# Patient Record
Sex: Female | Born: 1995 | Race: Black or African American | Hispanic: No | Marital: Single | Smoking: Current some day smoker
Health system: Southern US, Community
[De-identification: ages and names within clinical notes are randomized; demographics above are authoritative.]

## PROBLEM LIST (undated history)

## (undated) DIAGNOSIS — J45909 Unspecified asthma, uncomplicated: Secondary | ICD-10-CM

## (undated) HISTORY — PX: OTHER SURGICAL HISTORY: SHX169

---
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
## 2015-01-18 ENCOUNTER — Inpatient Hospital Stay (HOSPITAL_COMMUNITY): Payer: Medicaid Other

## 2015-01-18 ENCOUNTER — Inpatient Hospital Stay (HOSPITAL_COMMUNITY)
Admission: AD | Admit: 2015-01-18 | Discharge: 2015-01-18 | Disposition: A | Discharge status: Home / Self Care | Payer: Medicaid Other | Source: Ambulatory Visit | Attending: Obstetrics & Gynecology | Admitting: Obstetrics & Gynecology

## 2015-01-18 DIAGNOSIS — O209 Hemorrhage in early pregnancy, unspecified: Secondary | ICD-10-CM | POA: Diagnosis not present

## 2015-01-18 DIAGNOSIS — O99511 Diseases of the respiratory system complicating pregnancy, first trimester: Secondary | ICD-10-CM | POA: Diagnosis not present

## 2015-01-18 DIAGNOSIS — J45909 Unspecified asthma, uncomplicated: Secondary | ICD-10-CM | POA: Insufficient documentation

## 2015-01-18 DIAGNOSIS — R109 Unspecified abdominal pain: Secondary | ICD-10-CM | POA: Insufficient documentation

## 2015-01-18 DIAGNOSIS — O9989 Other specified diseases and conditions complicating pregnancy, childbirth and the puerperium: Secondary | ICD-10-CM

## 2015-01-18 DIAGNOSIS — Z3A01 Less than 8 weeks gestation of pregnancy: Secondary | ICD-10-CM | POA: Insufficient documentation

## 2015-01-18 DIAGNOSIS — J4520 Mild intermittent asthma, uncomplicated: Secondary | ICD-10-CM

## 2015-01-18 DIAGNOSIS — F17210 Nicotine dependence, cigarettes, uncomplicated: Secondary | ICD-10-CM | POA: Diagnosis not present

## 2015-01-18 DIAGNOSIS — O26899 Other specified pregnancy related conditions, unspecified trimester: Secondary | ICD-10-CM

## 2015-01-18 LAB — CBC WITH DIFFERENTIAL/PLATELET
Basophils Absolute: 0 10*3/uL (ref 0.0–0.1)
Basophils Relative: 0 % (ref 0–1)
Eosinophils Absolute: 0.1 10*3/uL (ref 0.0–0.7)
Eosinophils Relative: 1 % (ref 0–5)
HEMATOCRIT: 31.6 % — AB (ref 36.0–46.0)
Hemoglobin: 10.8 g/dL — ABNORMAL LOW (ref 12.0–15.0)
Lymphocytes Relative: 21 % (ref 12–46)
Lymphs Abs: 2.7 10*3/uL (ref 0.7–4.0)
MCH: 31.2 pg (ref 26.0–34.0)
MCHC: 34.2 g/dL (ref 30.0–36.0)
MCV: 91.3 fL (ref 78.0–100.0)
MONOS PCT: 6 % (ref 3–12)
Monocytes Absolute: 0.7 10*3/uL (ref 0.1–1.0)
NEUTROS ABS: 9.2 10*3/uL — AB (ref 1.7–7.7)
Neutrophils Relative %: 72 % (ref 43–77)
Platelets: 245 10*3/uL (ref 150–400)
RBC: 3.46 MIL/uL — ABNORMAL LOW (ref 3.87–5.11)
RDW: 13.6 % (ref 11.5–15.5)
WBC: 12.7 10*3/uL — ABNORMAL HIGH (ref 4.0–10.5)

## 2015-01-18 LAB — POCT PREGNANCY, URINE: Preg Test, Ur: POSITIVE — AB

## 2015-01-18 LAB — URINALYSIS, ROUTINE W REFLEX MICROSCOPIC
BILIRUBIN URINE: NEGATIVE
Glucose, UA: NEGATIVE mg/dL
HGB URINE DIPSTICK: NEGATIVE
Ketones, ur: NEGATIVE mg/dL
LEUKOCYTES UA: NEGATIVE
NITRITE: NEGATIVE
Protein, ur: NEGATIVE mg/dL
Specific Gravity, Urine: 1.025 (ref 1.005–1.030)
Urobilinogen, UA: 1 mg/dL (ref 0.0–1.0)
pH: 6.5 (ref 5.0–8.0)

## 2015-01-18 LAB — HCG, QUANTITATIVE, PREGNANCY: hCG, Beta Chain, Quant, S: 6026 m[IU]/mL — ABNORMAL HIGH (ref <5)

## 2015-01-18 LAB — ABO/RH: ABO/RH(D): AB POS

## 2015-01-18 MED ORDER — ALBUTEROL SULFATE HFA 108 (90 BASE) MCG/ACT IN AERS: 1.0000 | INHALATION_SPRAY | Freq: Four times a day (QID) | RESPIRATORY_TRACT | Status: AC | PRN

## 2015-01-18 NOTE — MAU Provider Note (Signed)
History     CSN: [id number]  Arrival date and time: 01/18/15 1414   None     Chief Complaint  Patient presents with  . Abdominal Pain   HPI Yolanda Colon 19 y.o. G1P0 at unknown gestational age reports to MAU for abdominal pain in suspected pregnancy.  Pregnancy test here today is positive.  Pt notes pain in mid-lower abdomen that is crampy and mild. It occurs after standing for long p[eriods of time and is relieved with rest.   She does not need to take medication for it but was concerned because of the potential pregnancy.  She denies vaginal bleeding, fever, weakness, shortness of breath, chest pain.   She notes she is out of her rescue inhaler and requests refill in case of emergency.   OB History    No data available      Past Medical History  Diagnosis Date  . Asthma     Past Surgical History  Procedure Laterality Date  . Arm surgery      No family history on file.  History  Substance Use Topics  . Smoking status: Current Some Day Smoker -- 0.50 packs/day    Types: Cigarettes  . Smokeless tobacco: Not on file  . Alcohol Use: No    Allergies: No Known Allergies  Prescriptions prior to admission  Medication Sig Dispense Refill Last Dose  . ALBUTEROL IN Inhale 1-2 puffs into the lungs every 4 (four) hours as needed (for shortness of breath).    unknown  . amphetamine-dextroamphetamine (ADDERALL XR) 30 MG 24 hr capsule Take 30 mg by mouth daily.     . beclomethasone (QVAR) 80 MCG/ACT inhaler Inhale 2 puffs into the lungs 2 (two) times daily as needed (for shortness of breath).    2 weeks ago  . naproxen (NAPROSYN) 500 MG tablet Take 1 tablet (500 mg total) by mouth 2 (two) times daily. 30 tablet 0   . phenazopyridine (PYRIDIUM) 200 MG tablet Take 1 tablet (200 mg total) by mouth 3 (three) times daily. 6 tablet 0     ROS Pertinent ROS in HPI.  All other systems are negative.   Physical Exam   Blood pressure 104/67, pulse 89, temperature 98.1 F (36.7 C),  temperature source Oral, resp. rate 16, height 5\' 6"  (1.676 m), weight 175 lb 4 oz (79.493 kg), last menstrual period 12/14/2014.  Physical Exam  Constitutional: She is oriented to person, place, and time. She appears well-developed and well-nourished. No distress.  HENT:  Head: Normocephalic and atraumatic.  Eyes: EOM are normal.  Neck: Normal range of motion.  Cardiovascular: Normal rate.   Respiratory: Effort normal. No respiratory distress.  GI: Soft. She exhibits no distension.  Musculoskeletal: Normal range of motion.  Neurological: She is alert and oriented to person, place, and time.  Psychiatric: She has a normal mood and affect. Her behavior is normal.    MAU Course  Procedures  MDM Ectopic workup ordered due to positive pregnancy test and abdominal pain (Incorrectly ordered labs under vaginal bleeding - which she is NOT having).   Results for orders placed or performed during the hospital encounter of 01/18/15 (from the past 48 hour(s))  Urinalysis, Routine w reflex microscopic (not at [facility])     Status: None   Collection Time: 01/18/15  3:21 PM  Result Value Ref Range   Color, Urine YELLOW YELLOW   APPearance CLEAR CLEAR   Specific Gravity, Urine 1.025 1.005 - 1.030   pH 6.5 5.0 -  8.0   Glucose, UA NEGATIVE NEGATIVE mg/dL   Hgb urine dipstick NEGATIVE NEGATIVE   Bilirubin Urine NEGATIVE NEGATIVE   Ketones, ur NEGATIVE NEGATIVE mg/dL   Protein, ur NEGATIVE NEGATIVE mg/dL   Urobilinogen, UA 1.0 0.0 - 1.0 mg/dL   Nitrite NEGATIVE NEGATIVE   Leukocytes, UA NEGATIVE NEGATIVE    Comment: MICROSCOPIC NOT DONE ON URINES WITH NEGATIVE PROTEIN, BLOOD, LEUKOCYTES, NITRITE, OR GLUCOSE <1000 mg/dL.  Pregnancy, urine POC     Status: Abnormal   Collection Time: 01/18/15  3:54 PM  Result Value Ref Range   Preg Test, Ur POSITIVE (A) NEGATIVE    Comment:        THE SENSITIVITY OF THIS METHODOLOGY IS >24 mIU/mL   CBC with Differential/Platelet     Status: Abnormal   Collection  Time: 01/18/15  4:15 PM  Result Value Ref Range   WBC 12.7 (H) 4.0 - 10.5 K/uL   RBC 3.46 (L) 3.87 - 5.11 MIL/uL   Hemoglobin 10.8 (L) 12.0 - 15.0 g/dL   HCT 81.131.6 (L) 91.436.0 - 78.246.0 %   MCV 91.3 78.0 - 100.0 fL   MCH 31.2 26.0 - 34.0 pg   MCHC 34.2 30.0 - 36.0 g/dL   RDW 95.613.6 21.311.5 - 08.615.5 %   Platelets 245 150 - 400 K/uL   Neutrophils Relative % 72 43 - 77 %   Neutro Abs 9.2 (H) 1.7 - 7.7 K/uL   Lymphocytes Relative 21 12 - 46 %   Lymphs Abs 2.7 0.7 - 4.0 K/uL   Monocytes Relative 6 3 - 12 %   Monocytes Absolute 0.7 0.1 - 1.0 K/uL   Eosinophils Relative 1 0 - 5 %   Eosinophils Absolute 0.1 0.0 - 0.7 K/uL   Basophils Relative 0 0 - 1 %   Basophils Absolute 0.0 0.0 - 0.1 K/uL  ABO/Rh     Status: None (Preliminary result)   Collection Time: 01/18/15  4:15 PM  Result Value Ref Range   ABO/RH(D) AB POS   hCG, quantitative, pregnancy     Status: Abnormal   Collection Time: 01/18/15  4:15 PM  Result Value Ref Range   hCG, Beta Chain, Quant, S 6026 (H) <5 mIU/mL    Comment:          GEST. AGE      CONC.  (mIU/mL)   <=1 WEEK        5 - 50     2 WEEKS       50 - 500     3 WEEKS       100 - 10,000     4 WEEKS     1,000 - 30,000     5 WEEKS     3,500 - 115,000   6-8 WEEKS     12,000 - 270,000    12 WEEKS     15,000 - 220,000        FEMALE AND NON-PREGNANT FEMALE:     LESS THAN 5 mIU/mL    [place] Ob Comp Less 14 Wks  01/18/2015   CLINICAL DATA:  Vaginal bleeding and abdominal pain for 1 week in first trimester pregnancy. Gestational age by LMP of 5 weeks 0 days.  EXAM: OBSTETRIC <14 WK US AND TRANSVAGINAL OB US  TECHNIQUE: Both transabdominal and transvaginal ultrasound examinations were performed for complete evaluation of the gestation as well as the maternal uterus, adnexal regions, and pelvic cul-de-sac. Transvaginal technique was performed to assess early pregnancy.  COMPARISON:  None.  FINDINGS: Intrauterine gestational sac: Visualized/normal in shape.  Yolk sac:  Visualized  Embryo:  Not  visualized  MSD: 7  mm   5 w   2  d  Maternal uterus/adnexae: Both ovaries are normal in appearance. No mass or free fluid identified.  IMPRESSION: Single intrauterine gestational sac measuring 5 weeks 2 days by mean sac diameter. This is concordant with LMP.  No significant maternal uterine or adnexal abnormality identified.   Electronically Signed   By: Myles Rosenthal M.D.   On: 01/18/2015 18:23   US Ob Transvaginal  01/18/2015   CLINICAL DATA:  Vaginal bleeding and abdominal pain for 1 week in first trimester pregnancy. Gestational age by LMP of 5 weeks 0 days.  EXAM: OBSTETRIC <14 WK [place] AND TRANSVAGINAL OB US  TECHNIQUE: Both transabdominal and transvaginal ultrasound examinations were performed for complete evaluation of the gestation as well as the maternal uterus, adnexal regions, and pelvic cul-de-sac. Transvaginal technique was performed to assess early pregnancy.  COMPARISON:  None.  FINDINGS: Intrauterine gestational sac: Visualized/normal in shape.  Yolk sac:  Visualized  Embryo:  Not visualized  MSD: 7  mm   5 w   2  d  Maternal uterus/adnexae: Both ovaries are normal in appearance. No mass or free fluid identified.  IMPRESSION: Single intrauterine gestational sac measuring 5 weeks 2 days by mean sac diameter. This is concordant with LMP.  No significant maternal uterine or adnexal abnormality identified.   Electronically Signed   By: Myles Rosenthal M.D.   On: 01/18/2015 18:23   Yolk sac seen on U/S therefore no further concern for ectopic.   No obvious sign of infection.  Discussed with Dr. Emelda Fear whom is in agreement that no further workup is required today.    Assessment and Plan  A:  1. Abdominal pain in pregnancy   2. Vaginal bleeding in pregnancy, first trimester   3.      Asthma P: Discharge to home Obtain [facility] asap List of OB care providers given.   Albuterol refilled  PNV qd Pregnancy verification letter given.  Patient may return to MAU as needed or if her condition were to  change or worsen    Bertram Denver 01/18/2015, 7:03 PM

## 2015-01-18 NOTE — MAU Note (Signed)
Patient presents with possible pregnancy and c/o abdominal pain X 1 week. Denies bleeding or discharge.

## 2015-01-18 NOTE — Discharge Instructions (Signed)
Abdominal Pain During Pregnancy °Abdominal pain is common in pregnancy. Most of the time, it does not cause harm. There are many causes of abdominal pain. Some causes are more serious than others. Some of the causes of abdominal pain in pregnancy are easily diagnosed. Occasionally, the diagnosis takes time to understand. Other times, the cause is not determined. Abdominal pain can be a sign that something is very wrong with the pregnancy, or the pain may have nothing to do with the pregnancy at all. For this reason, always tell your health care provider if you have any abdominal discomfort. °HOME CARE INSTRUCTIONS  °Monitor your abdominal pain for any changes. The following actions may help to alleviate any discomfort you are experiencing: °· Do not have sexual intercourse or put anything in your vagina until your symptoms go away completely. °· Get plenty of rest until your pain improves. °· Drink clear fluids if you feel nauseous. Avoid solid food as long as you are uncomfortable or nauseous. °· Only take over-the-counter or prescription medicine as directed by your health care provider. °· Keep all follow-up appointments with your health care provider. °SEEK IMMEDIATE MEDICAL CARE IF: °· You are bleeding, leaking fluid, or passing tissue from the vagina. °· You have increasing pain or cramping. °· You have persistent vomiting. °· You have painful or bloody urination. °· You have a fever. °· You notice a decrease in your baby's movements. °· You have extreme weakness or feel faint. °· You have shortness of breath, with or without abdominal pain. °· You develop a severe headache with abdominal pain. °· You have abnormal vaginal discharge with abdominal pain. °· You have persistent diarrhea. °· You have abdominal pain that continues even after rest, or gets worse. °MAKE SURE YOU:  °· Understand these instructions. °· Will watch your condition. °· Will get help right away if you are not doing well or get  worse. °Document Released: 06/24/2005 Document Revised: 04/14/2013 Document Reviewed: 01/21/2013 °[employer]® Patient Information ©2015 [employer]. This information is not intended to replace advice given to you by your health care provider. Make sure you discuss any questions you have with your health care provider. °Prenatal Care  °WHAT IS PRENATAL CARE?  °Prenatal care means health care during your pregnancy, before your baby is born. It is very important to take care of yourself and your baby during your pregnancy by:  °· Getting early prenatal care. If you know you are pregnant, or think you might be pregnant, call your health care provider as soon as possible. Schedule a visit for a prenatal exam. °· Getting regular prenatal care. Follow your health care provider's schedule for blood and other necessary tests. Do not miss appointments. °· Doing everything you can to keep yourself and your baby healthy during your pregnancy. °· Getting complete care. Prenatal care should include evaluation of the medical, dietary, educational, psychological, and social needs of you and your significant other. The medical and genetic history of your family and the family of your baby's father should be discussed with your health care provider. °· Discussing with your health care provider: °¨ Prescription, over-the-counter, and herbal medicines that you take. °¨ Any history of substance abuse, alcohol use, smoking, and illegal drug use. °¨ Any history of domestic abuse and violence. °¨ Immunizations you have received. °¨ Your nutrition and diet. °¨ The amount of exercise you do. °¨ Any environmental and occupational hazards to which you are exposed. °¨ History of sexually transmitted infections for both you   and your partner.  Previous pregnancies you have had. WHY IS PRENATAL CARE SO IMPORTANT?  By regularly seeing your health care provider, you help ensure that problems can be identified early so that they can be treated as  soon as possible. Other problems might be prevented. Many studies have shown that early and regular prenatal care is important for the health of mothers and their babies.  HOW CAN I TAKE CARE OF MYSELF WHILE I AM PREGNANT?  Here are ways to take care of yourself and your baby:   Start or continue taking your multivitamin with 400 micrograms (mcg) of folic acid every day.  Get early and regular prenatal care. It is very important to see a health care provider during your pregnancy. Your health care provider will check at each visit to make sure that you and your baby are healthy. If there are any problems, action can be taken right away to help you and your baby.  Eat a healthy diet that includes:  Fruits.  Vegetables.  Foods low in saturated fat.  Whole grains.  Calcium-rich foods, such as milk, yogurt, and hard cheeses.  Drink 6-8 glasses of liquids a day.  Unless your health care provider tells you not to, try to be physically active for 30 minutes, most days of the week. If you are pressed for time, you can get your activity in through 10-minute segments, three times a day.  Do not smoke, drink alcohol, or use drugs. These can cause long-term damage to your baby. Talk with your health care provider about steps to take to stop smoking. Talk with a member of your faith community, a counselor, a trusted friend, or your health care provider if you are concerned about your alcohol or drug use.  Ask your health care provider before taking any medicine, even over-the-counter medicines. Some medicines are not safe to take during pregnancy.  Get plenty of rest and sleep.  Avoid hot tubs and saunas during pregnancy.  Do not have X-rays taken unless absolutely necessary and with the recommendation of your health care provider. A lead shield can be placed on your abdomen to protect your baby when X-rays are taken in other parts of your body.  Do not empty the cat litter when you are  pregnant. It may contain a parasite that causes an infection called toxoplasmosis, which can cause birth defects. Also, use gloves when working in garden areas used by cats.  Do not eat uncooked or undercooked meats or fish.  Do not eat soft, mold-ripened cheeses (Brie, Camembert, and chevre) or soft, blue-veined cheese (Danish blue and Roquefort).  Stay away from toxic chemicals like:  Insecticides.  Solvents (some cleaners or paint thinners).  Lead.  Mercury.  Sexual intercourse may continue until the end of the pregnancy, unless you have a medical problem or there is a problem with the pregnancy and your health care provider tells you not to.  Do not wear high-heel shoes, especially during the second half of the pregnancy. You can lose your balance and fall.  Do not take long trips, unless absolutely necessary. Be sure to see your health care provider before going on the trip.  Do not sit in one position for more than 2 hours when on a trip.  Take a copy of your medical records when going on a trip. Know where a hospital is located in the city you are visiting, in case of an emergency.  Most dangerous household products will have pregnancy  warnings on their labels. Ask your health care provider about products if you are unsure.  Limit or eliminate your caffeine intake from coffee, tea, sodas, medicines, and chocolate.  Many women continue working through pregnancy. Staying active might help you stay healthier. If you have a question about the safety or the hours you work at your particular job, talk with your health care provider.  Get informed:  Read books.  Watch videos.  Go to childbirth classes for you and your significant other.  Talk with experienced moms.  Ask your health care provider about childbirth education classes for you and your partner. Classes can help you and your partner prepare for the birth of your baby.  Ask about a baby doctor (pediatrician) and  methods and pain medicine for labor, delivery, and possible cesarean delivery. HOW OFTEN SHOULD I SEE MY HEALTH CARE PROVIDER DURING PREGNANCY?  Your health care provider will give you a schedule for your prenatal visits. You will have visits more often as you get closer to the end of your pregnancy. An average pregnancy lasts about 40 weeks.  A typical schedule includes visiting your health care provider:   About once each month during your first 6 months of pregnancy.  Every 2 weeks during the next 2 months.  Weekly in the last month, until the delivery date. Your health care provider will probably want to see you more often if:  You are older than 35 years.  Your pregnancy is high risk because you have certain health problems or problems with the pregnancy, such as:  Diabetes.  High blood pressure.  The baby is not growing on schedule, according to the dates of the pregnancy. Your health care provider will do special tests to make sure you and your baby are not having any serious problems. WHAT HAPPENS DURING PRENATAL VISITS?   At your first prenatal visit, your health care provider will do a physical exam and talk to you about your health history and the health history of your partner and your family. Your health care provider will be able to tell you what date to expect your baby to be born on.  Your first physical exam will include checks of your blood pressure, measurements of your height and weight, and an exam of your pelvic organs. Your health care provider will do a Pap test if you have not had one recently and will do cultures of your cervix to make sure there is no infection.  At each prenatal visit, there will be tests of your blood, urine, blood pressure, weight, and the progress of the baby will be checked.  At your later prenatal visits, your health care provider will check how you are doing and how your baby is developing. You may have a number of tests done as your  pregnancy progresses.  Ultrasound exams are often used to check on your baby's growth and health.  You may have more urine and blood tests, as well as special tests, if needed. These may include amniocentesis to examine fluid in the pregnancy sac, stress tests to check how the baby responds to contractions, or a biophysical profile to measure your baby's well-being. Your health care provider will explain the tests and why they are necessary.  You should be tested for high blood sugar (gestational diabetes) between the 24th and 28th weeks of your pregnancy.  You should discuss with your health care provider your plans to breastfeed or bottle-feed your baby.  Each visit is also  a chance for you to learn about staying healthy during pregnancy and to ask questions. Document Released: 06/27/2003 Document Revised: 06/29/2013 Document Reviewed: 09/08/2013 [facility] Patient Information 2015 [place], [place]. This information is not intended to replace advice given to you by your health care provider. Make sure you discuss any questions you have with your health care provider.  Marland Kitchenob

## 2015-01-19 LAB — HIV ANTIBODY (ROUTINE TESTING W REFLEX): HIV Screen 4th Generation wRfx: NONREACTIVE

## 2015-01-19 LAB — RPR: RPR: NONREACTIVE

## 2017-10-11 ENCOUNTER — Emergency Department (HOSPITAL_COMMUNITY)
Admission: EM | Admit: 2017-10-11 | Discharge: 2017-10-12 | Disposition: A | Discharge status: Home / Self Care | Payer: Medicaid Other | Attending: Emergency Medicine | Admitting: Emergency Medicine

## 2017-10-11 ENCOUNTER — Encounter (HOSPITAL_COMMUNITY): Payer: Self-pay

## 2017-10-11 ENCOUNTER — Other Ambulatory Visit: Payer: Self-pay

## 2017-10-11 DIAGNOSIS — R109 Unspecified abdominal pain: Secondary | ICD-10-CM | POA: Diagnosis not present

## 2017-10-11 DIAGNOSIS — J45909 Unspecified asthma, uncomplicated: Secondary | ICD-10-CM | POA: Insufficient documentation

## 2017-10-11 DIAGNOSIS — D649 Anemia, unspecified: Secondary | ICD-10-CM | POA: Diagnosis not present

## 2017-10-11 DIAGNOSIS — F17210 Nicotine dependence, cigarettes, uncomplicated: Secondary | ICD-10-CM | POA: Insufficient documentation

## 2017-10-11 NOTE — ED Triage Notes (Signed)
Pt was using the bathroom and abd pain began about 30 min ago.

## 2017-10-12 ENCOUNTER — Emergency Department (HOSPITAL_COMMUNITY): Payer: Medicaid Other

## 2017-10-12 LAB — DIFFERENTIAL
BASOS ABS: 0 10*3/uL (ref 0.0–0.1)
Basophils Relative: 0 %
Eosinophils Absolute: 0.5 10*3/uL (ref 0.0–0.7)
Eosinophils Relative: 4 %
LYMPHS ABS: 2.4 10*3/uL (ref 0.7–4.0)
Lymphocytes Relative: 20 %
Monocytes Absolute: 0.9 10*3/uL (ref 0.1–1.0)
Monocytes Relative: 8 %
NEUTROS PCT: 68 %
Neutro Abs: 8.4 10*3/uL — ABNORMAL HIGH (ref 1.7–7.7)

## 2017-10-12 LAB — URINALYSIS, ROUTINE W REFLEX MICROSCOPIC
Bilirubin Urine: NEGATIVE
GLUCOSE, UA: NEGATIVE mg/dL
Hgb urine dipstick: NEGATIVE
Ketones, ur: NEGATIVE mg/dL
Leukocytes, UA: NEGATIVE
Nitrite: NEGATIVE
Protein, ur: NEGATIVE mg/dL
pH: 6 (ref 5.0–8.0)

## 2017-10-12 LAB — I-STAT BETA HCG BLOOD, ED (MC, WL, AP ONLY): I-stat hCG, quantitative: <5 m[IU]/mL (ref <5)

## 2017-10-12 LAB — CBC
HEMATOCRIT: 34.8 % — AB (ref 36.0–46.0)
HEMOGLOBIN: 11.4 g/dL — AB (ref 12.0–15.0)
MCH: 30.4 pg (ref 26.0–34.0)
MCHC: 32.8 g/dL (ref 30.0–36.0)
MCV: 92.8 fL (ref 78.0–100.0)
PLATELETS: 273 10*3/uL (ref 150–400)
RBC: 3.75 MIL/uL — AB (ref 3.87–5.11)
RDW: 13.1 % (ref 11.5–15.5)
WBC: 12.1 10*3/uL — ABNORMAL HIGH (ref 4.0–10.5)

## 2017-10-12 LAB — LIPASE, BLOOD: LIPASE: 29 U/L (ref 11–51)

## 2017-10-12 LAB — COMPREHENSIVE METABOLIC PANEL
ALT: 11 U/L — AB (ref 14–54)
AST: 19 U/L (ref 15–41)
Albumin: 3.8 g/dL (ref 3.5–5.0)
Alkaline Phosphatase: 52 U/L (ref 38–126)
Anion gap: 8 (ref 5–15)
BUN: 17 mg/dL (ref 6–20)
CO2: 24 mmol/L (ref 22–32)
CREATININE: 0.79 mg/dL (ref 0.44–1.00)
Calcium: 9.2 mg/dL (ref 8.9–10.3)
Chloride: 108 mmol/L (ref 101–111)
GFR calc Af Amer: >60 mL/min (ref >60)
GFR calc non Af Amer: >60 mL/min (ref >60)
Glucose, Bld: 80 mg/dL (ref 65–99)
POTASSIUM: 3.2 mmol/L — AB (ref 3.5–5.1)
Sodium: 140 mmol/L (ref 135–145)
Total Bilirubin: 0.5 mg/dL (ref 0.3–1.2)
Total Protein: 7.5 g/dL (ref 6.5–8.1)

## 2017-10-12 MED ORDER — IOPAMIDOL (ISOVUE-300) INJECTION 61%
100.0000 mL | Freq: Once | INTRAVENOUS | Status: AC | PRN
  Administered 2017-10-12: 100 mL via INTRAVENOUS

## 2017-10-12 MED ORDER — MORPHINE SULFATE (PF) 4 MG/ML IV SOLN
4.0000 mg | Freq: Once | INTRAVENOUS | Status: AC
  Administered 2017-10-12: 4 mg via INTRAVENOUS
  Filled 2017-10-12: qty 1

## 2017-10-12 MED ORDER — OXYCODONE-ACETAMINOPHEN 5-325 MG PO TABS: 1.0000 | ORAL_TABLET | ORAL | 0 refills | Status: AC | PRN

## 2017-10-12 MED ORDER — SODIUM CHLORIDE 0.9 % IV BOLUS
1000.0000 mL | Freq: Once | INTRAVENOUS | Status: AC
  Administered 2017-10-12: 1000 mL via INTRAVENOUS

## 2017-10-12 MED ORDER — ONDANSETRON HCL 4 MG PO TABS: 4.0000 mg | ORAL_TABLET | Freq: Four times a day (QID) | ORAL | 0 refills | Status: AC | PRN

## 2017-10-12 MED ORDER — ONDANSETRON HCL 4 MG/2ML IJ SOLN
4.0000 mg | Freq: Once | INTRAMUSCULAR | Status: AC
  Administered 2017-10-12: 4 mg via INTRAVENOUS
  Filled 2017-10-12: qty 2

## 2017-10-12 NOTE — ED Notes (Signed)
Pt not able to void at this time.

## 2017-10-12 NOTE — ED Provider Notes (Signed)
[facility] EMERGENCY DEPARTMENT Provider Note   CSN: [id number] Arrival date & time: 10/11/17  2335     History   Chief Complaint Chief Complaint  Patient presents with  . Abdominal Pain    rt side    HPI Yolanda Colon is a 22 y.o. female.  The history is provided by the patient.  She has a history of asthma and comes in with sudden onset this evening of right-sided abdominal pain.  She had just been to the bathroom to urinate when she developed sudden, severe pain in the right mid abdomen.  Pain is worse with any movement.  Feels better to keep her hips flexed.  Pain was rated at 10/10.  There was nausea and vomiting initially.  Nausea has abated and pain has subsided slightly.  She denies fever or chills.  She denies urinary urgency, frequency, tenesmus, dysuria.  Last menses was approximately the first week of March, and she would be due for her menses soon.  She is not using any contraception.  Past Medical History:  Diagnosis Date  . Asthma     There are no active problems to display for this patient.   Past Surgical History:  Procedure Laterality Date  . arm surgery       OB History   None      Home Medications    Prior to Admission medications   Medication Sig Start Date End Date Taking? Authorizing Provider  albuterol (PROVENTIL HFA;VENTOLIN HFA) 108 (90 BASE) MCG/ACT inhaler Inhale 1-2 puffs into the lungs every 6 (six) hours as needed for wheezing or shortness of breath. 01/18/15   Glyn Ade, Scot Jun, PA-C  beclomethasone (QVAR) 80 MCG/ACT inhaler Inhale 2 puffs into the lungs 2 (two) times daily as needed (for shortness of breath).     [provider]    Family History History reviewed. No pertinent family history.  Social History Social History   Tobacco Use  . Smoking status: Current Some Day Smoker    Packs/day: 0.50    Types: Cigarettes  . Smokeless tobacco: Never Used  Substance Use Topics  . Alcohol use: No  . Drug use: No       Allergies   Patient has no known allergies.   Review of Systems Review of Systems  All other systems reviewed and are negative.    Physical Exam Updated Vital Signs BP 121/81 (BP Location: Left Arm)   Pulse (!) 105   Temp 99.1 F (37.3 C) (Oral)   Resp 12   Ht 5\' 6"  (1.676 m)   Wt 77.1 kg (170 lb)   LMP 09/10/2017   SpO2 100%   BMI 27.44 kg/m   Physical Exam  Nursing note and vitals reviewed.  22 year old female, appears to be in pain, but is in no acute distress. Vital signs are significant for borderline tachycardia. Oxygen saturation is 100%, which is normal. Head is normocephalic and atraumatic. PERRLA, EOMI. Oropharynx is clear. Neck is nontender and supple without adenopathy or JVD. Back is nontender and there is no CVA tenderness. Lungs are clear without rales, wheezes, or rhonchi. Chest is nontender. Heart has regular rate and rhythm without murmur. Abdomen is soft, flat, with moderate tenderness throughout the right side of the abdomen.  Maximum tenderness is in the right mid abdomen.  There is no rebound or guarding.  There are no masses or hepatosplenomegaly and peristalsis is hypoactive. Extremities have no cyanosis or edema, full range of motion is  present. Skin is warm and dry without rash. Neurologic: Mental status is normal, cranial nerves are intact, there are no motor or sensory deficits.  ED Treatments / Results  Labs (all labs ordered are listed, but only abnormal results are displayed) Labs Reviewed  COMPREHENSIVE METABOLIC PANEL - Abnormal; Notable for the following components:      Result Value   Potassium 3.2 (*)    ALT 11 (*)    All other components within normal limits  CBC - Abnormal; Notable for the following components:   WBC 12.1 (*)    RBC 3.75 (*)    Hemoglobin 11.4 (*)    HCT 34.8 (*)    All other components within normal limits  URINALYSIS, ROUTINE W REFLEX MICROSCOPIC - Abnormal; Notable for the following components:    Specific Gravity, Urine >1.046 (*)    All other components within normal limits  DIFFERENTIAL - Abnormal; Notable for the following components:   Neutro Abs 8.4 (*)    All other components within normal limits  LIPASE, BLOOD  I-STAT BETA HCG BLOOD, ED ([facility], [facility], [facility] ONLY)    EKG EKG Interpretation  Date/Time:  Saturday October 11 2017 23:44:48 EDT Ventricular Rate:  103 PR Interval:    QRS Duration: 89 QT Interval:  322 QTC Calculation: 422 R Axis:   84 Text Interpretation:  Sinus tachycardia Otherwise within normal limits No old tracing to compare Confirmed by Dione Booze ([id number]) on 10/12/2017 12:03:35 AM   Radiology Ct Abdomen Pelvis W Contrast  Result Date: 10/12/2017 CLINICAL DATA:  Cramping abdominal pain beginning at midnight. EXAM: CT ABDOMEN AND PELVIS WITH CONTRAST TECHNIQUE: Multidetector CT imaging of the abdomen and pelvis was performed using the standard protocol following bolus administration of intravenous contrast. CONTRAST:  ISOVUE-300 IOPAMIDOL (ISOVUE-300) INJECTION 61% COMPARISON:  08/31/2014 FINDINGS: Lower chest: Lung bases are clear. Hepatobiliary: No focal liver abnormality is seen. No gallstones, gallbladder wall thickening, or biliary dilatation. Pancreas: Unremarkable. No pancreatic ductal dilatation or surrounding inflammatory changes. Spleen: Normal in size without focal abnormality. Adrenals/Urinary Tract: 2 mm stone in the upper pole right kidney. Additional punctate stones in the midpole right kidney. No hydronephrosis or hydroureter. Renal nephrograms are symmetrical. Bladder is unremarkable. Stomach/Bowel: Stomach is within normal limits. Appendix appears normal. No evidence of bowel wall thickening, distention, or inflammatory changes. No evidence of diverticulitis. Vascular/Lymphatic: No significant vascular findings are present. No enlarged abdominal or pelvic lymph nodes. Reproductive: Uterus and bilateral adnexa are unremarkable. Other: No abdominal wall  hernia or abnormality. No abdominopelvic ascites. Musculoskeletal: No acute or significant osseous findings. IMPRESSION: 1. No acute process demonstrated in the abdomen or pelvis. No evidence of bowel obstruction or inflammation. No changes to suggest diverticulitis. 2. Nonobstructing stones in the right kidney. Electronically Signed   By: Burman Nieves M.D.   On: 10/12/2017 02:11    Procedures Procedures  Medications Ordered in ED Medications  ondansetron (ZOFRAN) injection 4 mg (4 mg Intravenous Given 10/12/17 0107)  sodium chloride 0.9 % bolus 1,000 mL (0 mLs Intravenous Stopped 10/12/17 0222)  iopamidol (ISOVUE-300) 61 % injection 100 mL (100 mLs Intravenous Contrast Given 10/12/17 0114)  morphine 4 MG/ML injection 4 mg (4 mg Intravenous Given 10/12/17 0448)     Initial Impression / Assessment and Plan / ED Course  I have reviewed the triage vital signs and the nursing notes.  Pertinent labs & imaging results that were available during my care of the patient were reviewed by me and considered in my  medical decision making (see chart for details).  Right-sided abdominal pain of uncertain cause.  Consider urolithiasis, cholecystitis, ovarian cyst, appendicitis.  Doubt diverticulitis in someone this young.  Also, consider urinary tract infection with pyelonephritis.  Old records are reviewed, and she has no relevant past visits.  She will be given IV fluids, morphine, ondansetron.  Screening labs are obtained showing mild leukocytosis and mild anemia.  She will be sent for CT of abdomen and pelvis.  CT scan shows no acute process.  Urinalysis shows high specific gravity but otherwise normal.  On reexam, exam is not changed.  At this point, suspect mesenteric adenitis.  No evidence of more soft serious pathology.  She is given a take-home pack of oxycodone-acetaminophen and ondansetron, given strict return precautions.  Final Clinical Impressions(s) / ED Diagnoses   Final diagnoses:  Abdominal  pain, unspecified abdominal location    ED Discharge Orders        Ordered    oxyCODONE-acetaminophen (PERCOCET) 5-325 MG tablet  Every 4 hours PRN     10/12/17 0512    ondansetron (ZOFRAN) 4 MG tablet  Every 6 hours PRN     10/12/17 0512       Dione BoozeGlick, Jessicah Croll, MD 10/12/17 [phone]

## 2017-10-12 NOTE — ED Notes (Signed)
In and Out cath with the assistance of [facility]

## 2017-10-12 NOTE — Discharge Instructions (Addendum)
Return if pain is getting worse. °

## 2017-10-13 MED FILL — Oxycodone w/ Acetaminophen Tab 5-325 MG: ORAL | Qty: 6 | Status: AC

## 2017-10-13 MED FILL — Ondansetron HCl Tab 4 MG: ORAL | Qty: 4 | Status: AC

## 2017-12-30 ENCOUNTER — Other Ambulatory Visit: Payer: Self-pay

## 2017-12-30 ENCOUNTER — Emergency Department (HOSPITAL_BASED_OUTPATIENT_CLINIC_OR_DEPARTMENT_OTHER): Payer: No Typology Code available for payment source

## 2017-12-30 ENCOUNTER — Emergency Department (HOSPITAL_BASED_OUTPATIENT_CLINIC_OR_DEPARTMENT_OTHER)
Admission: EM | Admit: 2017-12-30 | Discharge: 2017-12-30 | Disposition: A | Discharge status: Home / Self Care | Payer: No Typology Code available for payment source | Attending: Emergency Medicine | Admitting: Emergency Medicine

## 2017-12-30 ENCOUNTER — Encounter (HOSPITAL_BASED_OUTPATIENT_CLINIC_OR_DEPARTMENT_OTHER): Payer: Self-pay | Admitting: Emergency Medicine

## 2017-12-30 DIAGNOSIS — F17210 Nicotine dependence, cigarettes, uncomplicated: Secondary | ICD-10-CM | POA: Insufficient documentation

## 2017-12-30 DIAGNOSIS — Z79899 Other long term (current) drug therapy: Secondary | ICD-10-CM | POA: Diagnosis not present

## 2017-12-30 DIAGNOSIS — S63630A Sprain of interphalangeal joint of right index finger, initial encounter: Secondary | ICD-10-CM | POA: Diagnosis not present

## 2017-12-30 DIAGNOSIS — S60940A Unspecified superficial injury of right index finger, initial encounter: Secondary | ICD-10-CM | POA: Diagnosis present

## 2017-12-30 DIAGNOSIS — Y92410 Unspecified street and highway as the place of occurrence of the external cause: Secondary | ICD-10-CM | POA: Insufficient documentation

## 2017-12-30 DIAGNOSIS — J45909 Unspecified asthma, uncomplicated: Secondary | ICD-10-CM | POA: Insufficient documentation

## 2017-12-30 DIAGNOSIS — Y9389 Activity, other specified: Secondary | ICD-10-CM | POA: Insufficient documentation

## 2017-12-30 DIAGNOSIS — Y999 Unspecified external cause status: Secondary | ICD-10-CM | POA: Insufficient documentation
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
## 2017-12-30 NOTE — ED Notes (Signed)
Patient transported to X-ray 
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
## 2018-03-27 ENCOUNTER — Emergency Department (HOSPITAL_BASED_OUTPATIENT_CLINIC_OR_DEPARTMENT_OTHER)
Admission: EM | Admit: 2018-03-27 | Discharge: 2018-03-27 | Disposition: A | Discharge status: Home / Self Care | Payer: No Typology Code available for payment source | Attending: Emergency Medicine | Admitting: Emergency Medicine

## 2018-03-27 ENCOUNTER — Encounter (HOSPITAL_BASED_OUTPATIENT_CLINIC_OR_DEPARTMENT_OTHER): Payer: Self-pay | Admitting: *Deleted

## 2018-03-27 ENCOUNTER — Ambulatory Visit: Payer: Medicaid Other | Admitting: Medical

## 2018-03-27 ENCOUNTER — Other Ambulatory Visit: Payer: Self-pay

## 2018-03-27 ENCOUNTER — Emergency Department (HOSPITAL_BASED_OUTPATIENT_CLINIC_OR_DEPARTMENT_OTHER): Payer: No Typology Code available for payment source

## 2018-03-27 DIAGNOSIS — Y9389 Activity, other specified: Secondary | ICD-10-CM | POA: Insufficient documentation

## 2018-03-27 DIAGNOSIS — S62650A Nondisplaced fracture of medial phalanx of right index finger, initial encounter for closed fracture: Secondary | ICD-10-CM | POA: Insufficient documentation

## 2018-03-27 DIAGNOSIS — Y999 Unspecified external cause status: Secondary | ICD-10-CM | POA: Diagnosis not present

## 2018-03-27 DIAGNOSIS — S62660A Nondisplaced fracture of distal phalanx of right index finger, initial encounter for closed fracture: Secondary | ICD-10-CM | POA: Insufficient documentation

## 2018-03-27 DIAGNOSIS — Y92410 Unspecified street and highway as the place of occurrence of the external cause: Secondary | ICD-10-CM | POA: Diagnosis not present

## 2018-03-27 DIAGNOSIS — S6991XA Unspecified injury of right wrist, hand and finger(s), initial encounter: Secondary | ICD-10-CM

## 2018-03-27 DIAGNOSIS — S62600A Fracture of unspecified phalanx of right index finger, initial encounter for closed fracture: Secondary | ICD-10-CM

## 2018-03-27 MED ORDER — IBUPROFEN 800 MG PO TABS
800.0000 mg | ORAL_TABLET | Freq: Once | ORAL | Status: AC
  Administered 2018-03-27: 800 mg via ORAL
  Filled 2018-03-27: qty 1

## 2018-03-27 MED ORDER — OXYCODONE-ACETAMINOPHEN 5-325 MG PO TABS
1.0000 | ORAL_TABLET | Freq: Once | ORAL | Status: AC
  Administered 2018-03-27: 1 via ORAL
  Filled 2018-03-27: qty 1
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
## 2018-08-03 ENCOUNTER — Emergency Department (HOSPITAL_BASED_OUTPATIENT_CLINIC_OR_DEPARTMENT_OTHER)
Admission: EM | Admit: 2018-08-03 | Discharge: 2018-08-03 | Disposition: A | Discharge status: Home / Self Care | Payer: Medicaid Other | Attending: Emergency Medicine | Admitting: Emergency Medicine

## 2018-08-03 ENCOUNTER — Other Ambulatory Visit: Payer: Self-pay

## 2018-08-03 ENCOUNTER — Encounter (HOSPITAL_BASED_OUTPATIENT_CLINIC_OR_DEPARTMENT_OTHER): Payer: Self-pay | Admitting: *Deleted

## 2018-08-03 DIAGNOSIS — N644 Mastodynia: Secondary | ICD-10-CM | POA: Insufficient documentation

## 2018-08-03 DIAGNOSIS — Z5321 Procedure and treatment not carried out due to patient leaving prior to being seen by health care provider: Secondary | ICD-10-CM | POA: Insufficient documentation

## 2018-08-03 NOTE — ED Notes (Signed)
Pt never answered called x3

## 2018-08-03 NOTE — ED Triage Notes (Signed)
Left breast has a possible abscess. Redness, hot, painful x 2 days. No drainage from her nipple.

## 2019-03-16 ENCOUNTER — Encounter (HOSPITAL_BASED_OUTPATIENT_CLINIC_OR_DEPARTMENT_OTHER): Payer: Self-pay

## 2019-03-16 ENCOUNTER — Emergency Department (HOSPITAL_BASED_OUTPATIENT_CLINIC_OR_DEPARTMENT_OTHER)
Admission: EM | Admit: 2019-03-16 | Discharge: 2019-03-16 | Disposition: A | Discharge status: Home / Self Care | Payer: Medicaid Other | Attending: Emergency Medicine | Admitting: Emergency Medicine

## 2019-03-16 ENCOUNTER — Other Ambulatory Visit: Payer: Self-pay

## 2019-03-16 DIAGNOSIS — U071 COVID-19: Secondary | ICD-10-CM | POA: Diagnosis not present

## 2019-03-16 DIAGNOSIS — J45909 Unspecified asthma, uncomplicated: Secondary | ICD-10-CM | POA: Insufficient documentation

## 2019-03-16 DIAGNOSIS — R438 Other disturbances of smell and taste: Secondary | ICD-10-CM | POA: Diagnosis present

## 2019-03-16 DIAGNOSIS — F17210 Nicotine dependence, cigarettes, uncomplicated: Secondary | ICD-10-CM | POA: Insufficient documentation

## 2019-03-16 DIAGNOSIS — R430 Anosmia: Secondary | ICD-10-CM

## 2019-03-16 DIAGNOSIS — R0981 Nasal congestion: Secondary | ICD-10-CM

## 2019-03-16 DIAGNOSIS — R432 Parageusia: Secondary | ICD-10-CM

## 2019-03-16 MED ORDER — FLUTICASONE PROPIONATE 50 MCG/ACT NA SUSP: 2.0000 | Freq: Every day | NASAL | 0 refills | Status: AC

## 2019-03-16 NOTE — Discharge Instructions (Signed)
Your symptoms are likely consistent with a viral illness. Viruses do not require or respond to antibiotics. Treatment is symptomatic care and it is important to note that these symptoms may last for 7-14 days.   Hand washing: Wash your hands throughout the day, but especially before and after touching the face, using the restroom, sneezing, coughing, or touching surfaces that have been coughed or sneezed upon. Hydration: Symptoms of most illnesses will be intensified and complicated by dehydration. Dehydration can also extend the duration of symptoms. Drink plenty of fluids and get plenty of rest. You should be drinking at least half a liter of water an hour to stay hydrated. Electrolyte drinks (ex. Gatorade, Powerade, Pedialyte) are also encouraged. You should be drinking enough fluids to make your urine light yellow, almost clear. If this is not the case, you are not drinking enough water. Please note that some of the treatments indicated below will not be effective if you are not adequately hydrated. Pain or fever: Ibuprofen, Naproxen, or acetaminophen (generic for Tylenol) for pain or fever.  Antiinflammatory medications: Take 600 mg of ibuprofen every 6 hours or 440 mg (over the counter dose) to 500 mg (prescription dose) of naproxen every 12 hours for the next 3 days. After this time, these medications may be used as needed for pain. Take these medications with food to avoid upset stomach. Choose only one of these medications, do not take them together. Acetaminophen (generic for Tylenol): Should you continue to have additional pain while taking the ibuprofen or naproxen, you may add in acetaminophen as needed. Your daily total maximum amount of acetaminophen from all sources should be limited to 4000mg /day for persons without liver problems, or 2000mg /day for those with liver problems. Zyrtec or Claritin: May add these medication daily to control underlying symptoms of congestion, sneezing, and other  signs of allergies.  These medications are available over-the-counter. Generics: Cetirizine (generic for Zyrtec) and loratadine (generic for Claritin). Fluticasone: Use fluticasone (generic for Flonase), as directed, for nasal and sinus congestion.  This medication is available over-the-counter. Congestion: Plain guaifenesin (generic for plain Mucinex) may help relieve congestion. Saline sinus rinses and saline nasal sprays may also help relieve congestion. If you do not have high blood pressure, heart problems, or an allergy to such medications, you may also try phenylephrine or Sudafed. Sore throat: Warm liquids or Chloraseptic spray may help soothe a sore throat. Gargle twice a day with a salt water solution made from a half teaspoon of salt in a cup of warm water.  Follow up: Follow up with a primary care provider within the next two weeks should symptoms fail to resolve. Return: Return to the ED for significantly worsening symptoms, shortness of breath, persistent vomiting, large amounts of blood in stool, or any other major concerns.  For prescription assistance, may try using prescription discount sites or apps, such as goodrx.com  You have a test pending for COVID-19.  Results typically return within about 48 hours.  Be sure to check MyChart for updated results.  We recommend isolating yourself until results are received.  Patients who have symptoms consistent with COVID-19 should self isolated for: At least 3 days (72 hours) have passed since recovery, defined as resolution of fever without the use of fever reducing medications and improvement in respiratory symptoms (e.g., cough, shortness of breath), and At least 7 days have passed since symptoms first appeared.  If you have no symptoms, but your test returns positive, recommend isolating for at least 10  days.

## 2019-03-16 NOTE — ED Provider Notes (Signed)
[facility] EMERGENCY DEPARTMENT Provider Note   CSN: [id number] Arrival date & time: 03/16/19  1349     History   Chief Complaint No chief complaint on file.   HPI Yolanda Colon is a 23 y.o. female.     HPI   Yolanda Colon is a 23 y.o. female, with a history of asthma, presenting to the ED with loss of taste and smell for the last 2 to 3 days.  Also notes nighttime congestion.  Her significant other is here in the ED with similar symptoms. She states her job is requiring testing for COVID-19 before returning to work. Denies fever/chills, cough, shortness of breath, chest pain, abdominal pain, headache, sore throat, dizziness, sinus pain, N/V/D, or any other complaints.  Past Medical History:  Diagnosis Date  . Asthma     There are no active problems to display for this patient.   Past Surgical History:  Procedure Laterality Date  . arm surgery       OB History   No obstetric history on file.      Home Medications    Prior to Admission medications   Medication Sig Start Date End Date Taking? Authorizing Provider  albuterol (PROVENTIL HFA;VENTOLIN HFA) 108 (90 BASE) MCG/ACT inhaler Inhale 1-2 puffs into the lungs every 6 (six) hours as needed for wheezing or shortness of breath. 01/18/15   Glyn Adeeague Clark, Scot JunKaren E, PA-C  beclomethasone (QVAR) 80 MCG/ACT inhaler Inhale 2 puffs into the lungs 2 (two) times daily as needed (for shortness of breath).     [provider]  fluticasone (FLONASE) 50 MCG/ACT nasal spray Place 2 sprays into both nostrils daily. 03/16/19   Joy, Shawn C, PA-C  ondansetron (ZOFRAN) 4 MG tablet Take 1 tablet (4 mg total) by mouth every 6 (six) hours as needed. 10/12/17   Dione BoozeGlick, David, MD  oxyCODONE-acetaminophen (PERCOCET) 5-325 MG tablet Take 1 tablet by mouth every 4 (four) hours as needed for moderate pain. 10/12/17   Dione BoozeGlick, David, MD    Family History History reviewed. No pertinent family history.  Social History Social  History   Tobacco Use  . Smoking status: Current Some Day Smoker    Packs/day: 0.50    Types: Cigarettes  . Smokeless tobacco: Never Used  Substance Use Topics  . Alcohol use: No  . Drug use: No     Allergies   Patient has no known allergies.   Review of Systems Review of Systems  Constitutional: Negative for chills, diaphoresis and fever.  HENT: Positive for congestion. Negative for rhinorrhea, sinus pressure, sinus pain, sore throat, trouble swallowing and voice change.   Respiratory: Negative for cough and shortness of breath.   Cardiovascular: Negative for chest pain.  Gastrointestinal: Negative for abdominal pain, diarrhea, nausea and vomiting.  Musculoskeletal: Negative for myalgias.  Neurological: Negative for dizziness, light-headedness and headaches.  All other systems reviewed and are negative.    Physical Exam Updated Vital Signs BP (!) 114/53 (BP Location: Left Arm)   Pulse 89   Temp 98.5 F (36.9 C) (Oral)   Resp 18   Ht 5\' 6"  (1.676 m)   Wt 80.4 kg Comment: Simultaneous filing. User may not have seen previous data.  LMP 03/09/2019 (Approximate)   SpO2 100%   BMI 28.62 kg/m   Physical Exam Vitals signs and nursing note reviewed.  Constitutional:      General: She is not in acute distress.    Appearance: She is well-developed. She is not diaphoretic.  HENT:     Head: Normocephalic and atraumatic.     Nose: Mucosal edema and congestion present.     Right Sinus: No maxillary sinus tenderness or frontal sinus tenderness.     Left Sinus: No maxillary sinus tenderness or frontal sinus tenderness.     Mouth/Throat:     Mouth: Mucous membranes are moist.     Pharynx: Oropharynx is clear.  Eyes:     Conjunctiva/sclera: Conjunctivae normal.  Neck:     Musculoskeletal: Neck supple.  Cardiovascular:     Rate and Rhythm: Normal rate and regular rhythm.     Pulses: Normal pulses.  Pulmonary:     Effort: Pulmonary effort is normal. No respiratory  distress.     Breath sounds: Normal breath sounds.  Abdominal:     Tenderness: There is no guarding.  Musculoskeletal:     Right lower leg: No edema.     Left lower leg: No edema.  Lymphadenopathy:     Cervical: No cervical adenopathy.  Skin:    General: Skin is warm and dry.  Neurological:     Mental Status: She is alert.  Psychiatric:        Mood and Affect: Mood and affect normal.        Speech: Speech normal.        Behavior: Behavior normal.      ED Treatments / Results  Labs (all labs ordered are listed, but only abnormal results are displayed) Labs Reviewed  NOVEL CORONAVIRUS, NAA (HOSP ORDER, SEND-OUT TO REF LAB; TAT 18-24 HRS)    EKG None  Radiology No results found.  Procedures Procedures (including critical care time)  Medications Ordered in ED Medications - No data to display   Initial Impression / Assessment and Plan / ED Course  I have reviewed the triage vital signs and the nursing notes.  Pertinent labs & imaging results that were available during my care of the patient were reviewed by me and considered in my medical decision making (see chart for details).        Patient presents with loss in taste and smell as well as some nasal congestion.  We discussed testing for COVID-19 as well as symptomatic care. The patient was given instructions for home care as well as return precautions. Patient voices understanding of these instructions, accepts the plan, and is comfortable with discharge.   Yolanda Colon was evaluated in Emergency Department on 03/16/2019 for the symptoms described in the history of present illness. She was evaluated in the context of the global COVID-19 pandemic, which necessitated consideration that the patient might be at risk for infection with the SARS-CoV-2 virus that causes COVID-19. Institutional protocols and algorithms that pertain to the evaluation of patients at risk for COVID-19 are in a state of rapid change based on  information released by regulatory bodies including the CDC and federal and state organizations. These policies and algorithms were followed during the patient's care in the ED.  Final Clinical Impressions(s) / ED Diagnoses   Final diagnoses:  Nasal congestion  Loss of taste  Loss of smell    ED Discharge Orders         Ordered    fluticasone (FLONASE) 50 MCG/ACT nasal spray  Daily     03/16/19 [place], [place], PA-C 03/16/19 [place], [place], DO 03/16/19 1731

## 2019-03-16 NOTE — ED Notes (Signed)
Pt states employer will not let her return to work without negative covid testing.

## 2019-03-16 NOTE — ED Triage Notes (Signed)
3x days loss of taste/smell, denies fever or cough. Vomited 1x 2 days ago, no n/d, NAD

## 2019-03-18 LAB — NOVEL CORONAVIRUS, NAA (HOSP ORDER, SEND-OUT TO REF LAB; TAT 18-24 HRS): SARS-CoV-2, NAA: DETECTED — AB

## 2020-01-04 IMAGING — CT CT ABD-PELV W/ CM
2 of 4 series · 16 of 46 positions shown, 18 images · IV contrast (Isovue)
Comparison: 08/31/2014

CLINICAL DATA: Cramping abdominal pain beginning at midnight.

EXAM:
CT ABDOMEN AND PELVIS WITH CONTRAST
TECHNIQUE: Multidetector CT imaging of the abdomen and pelvis was performed
using the standard protocol following bolus administration of
intravenous contrast.
CONTRAST:  100mL HUSAXL-2FF IOPAMIDOL (HUSAXL-2FF) INJECTION 61%

[Series 2: axial st · axial · 0.76mm/px · z∈[+1181,+1591]mm · 13 of 90 slices shown, 15 images]
[im 4/90  soft-tissue]
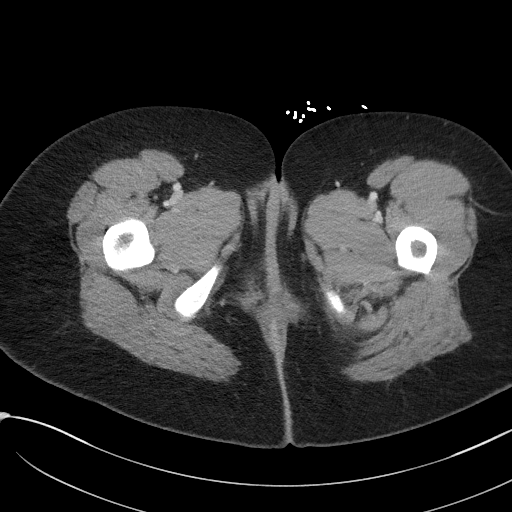
[im 4/90  bone]
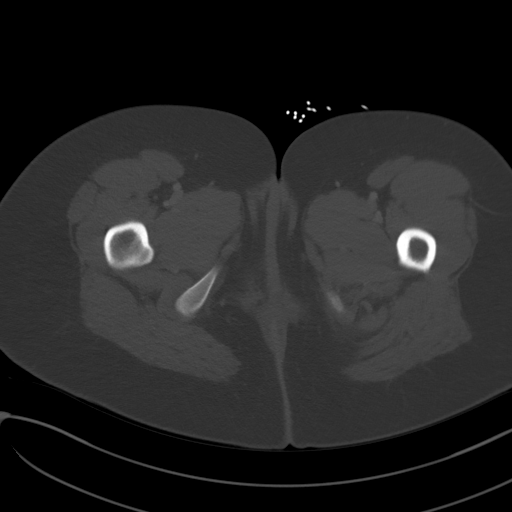
[im 11/90  soft-tissue]
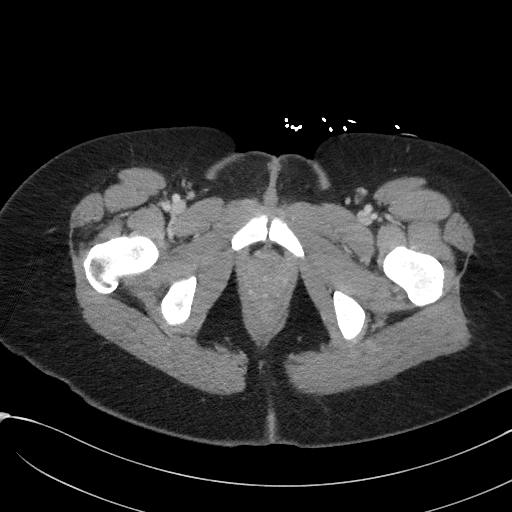
[im 18/90  soft-tissue]
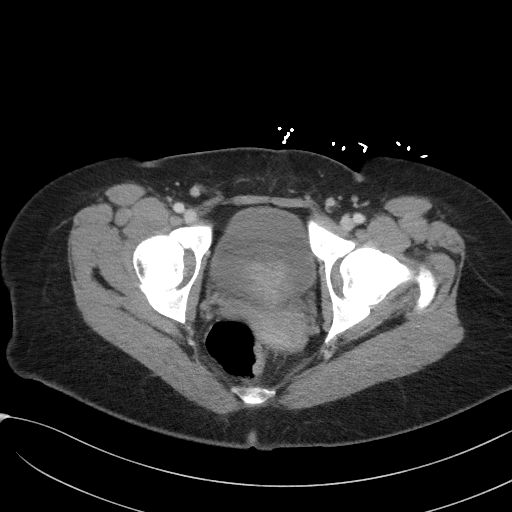
[im 25/90  soft-tissue]
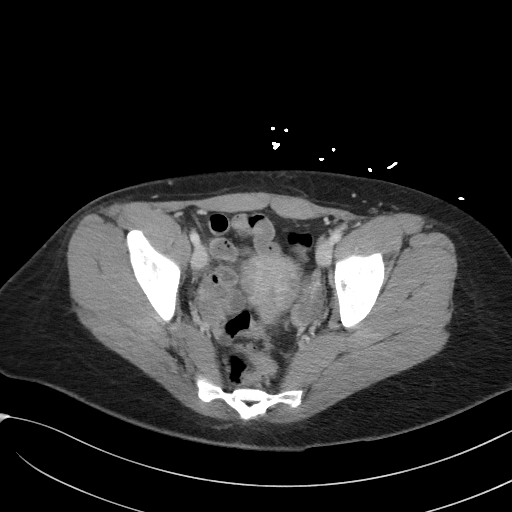
[im 33/90  soft-tissue]
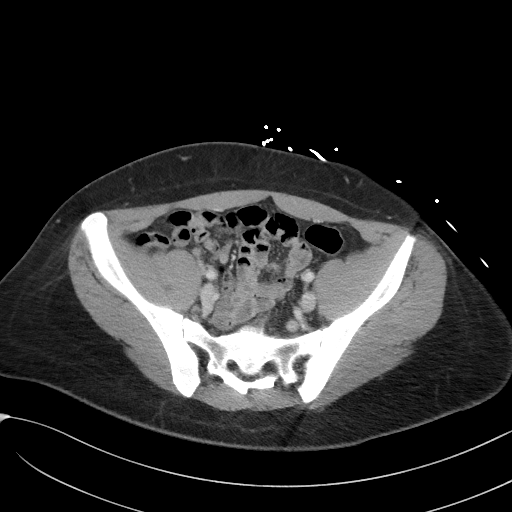
[im 40/90  soft-tissue]
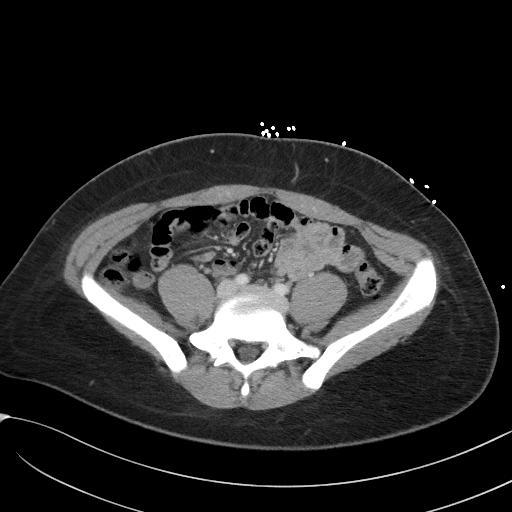
[im 47/90  soft-tissue]
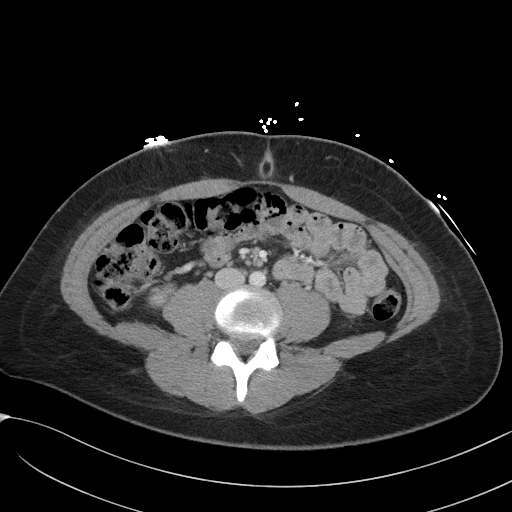
[im 50/90  soft-tissue]
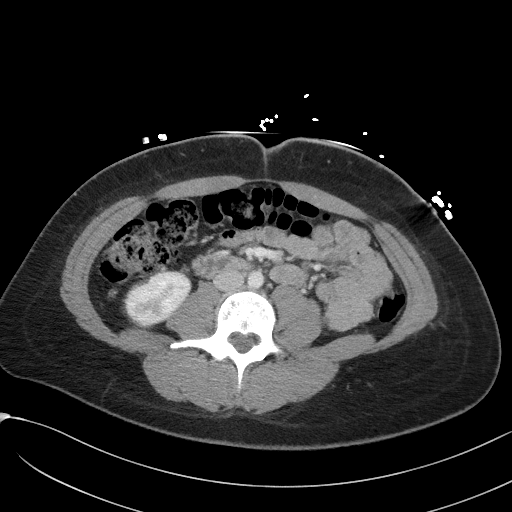
[im 57/90  soft-tissue]
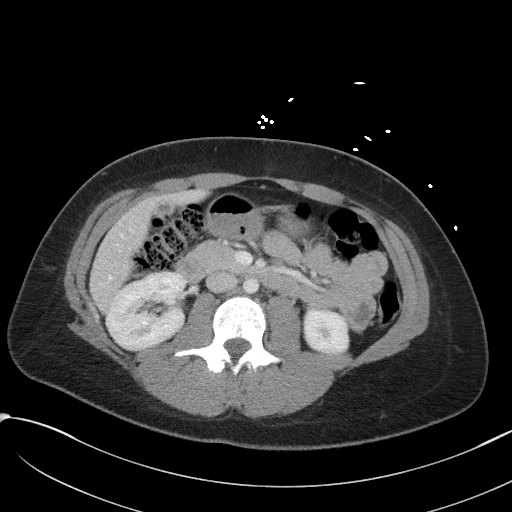
[im 57/90  bone]
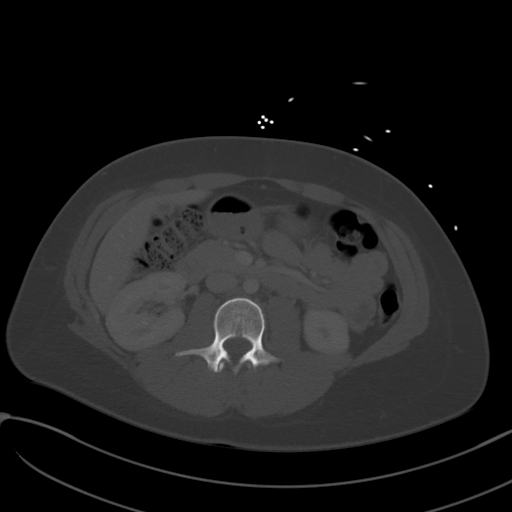
[im 65/90  soft-tissue]
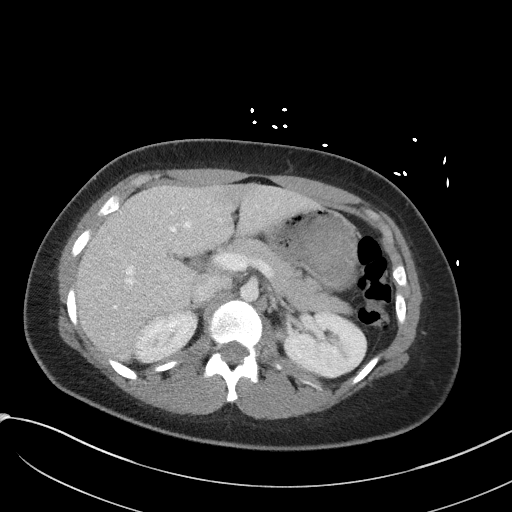
[im 72/90  soft-tissue]
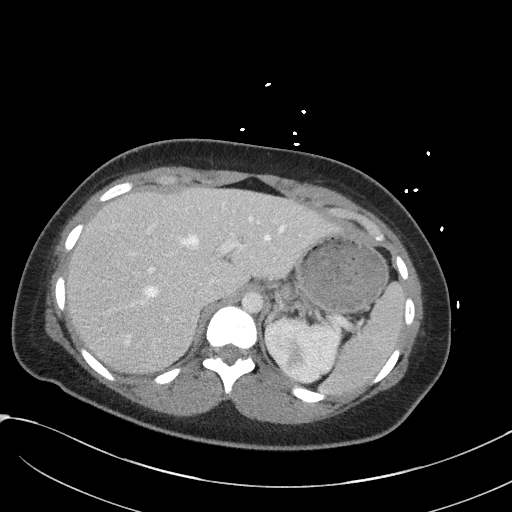
[im 79/90  soft-tissue]
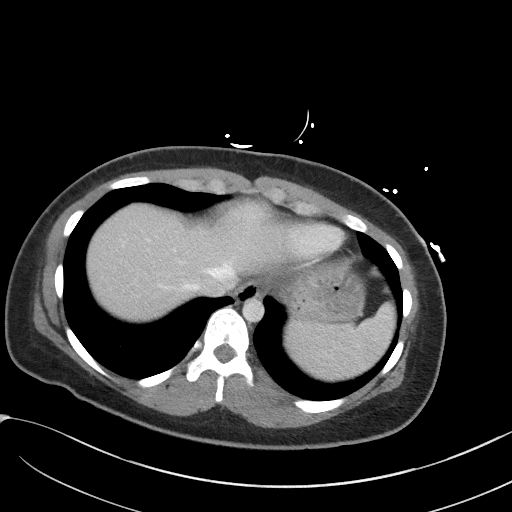
[im 86/90  soft-tissue]
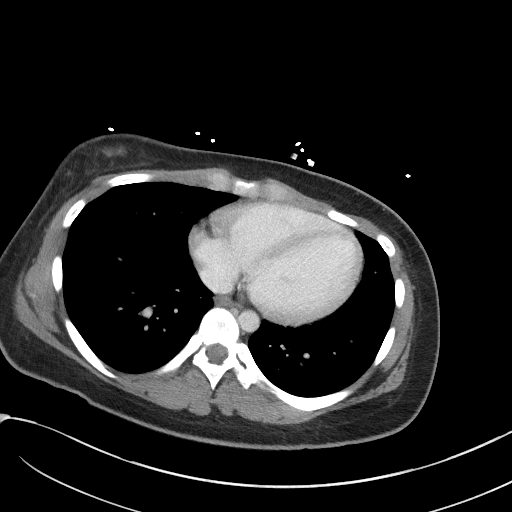

[Series 5: coronal st · coronal · 0.80mm/px · 3 of 102 slices shown]
[im 34/102  soft-tissue]
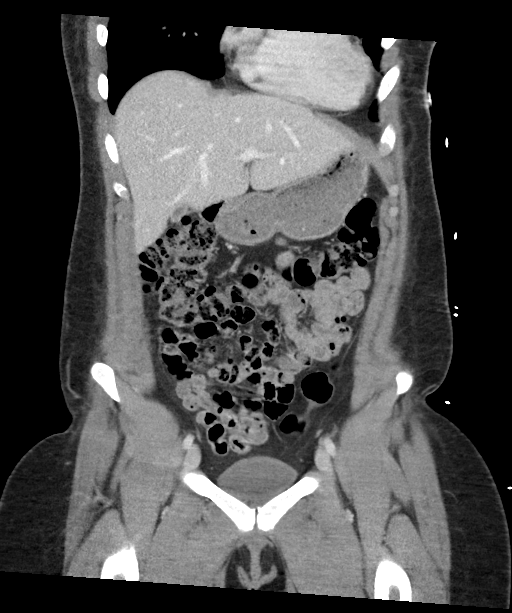
[im 45/102  soft-tissue]
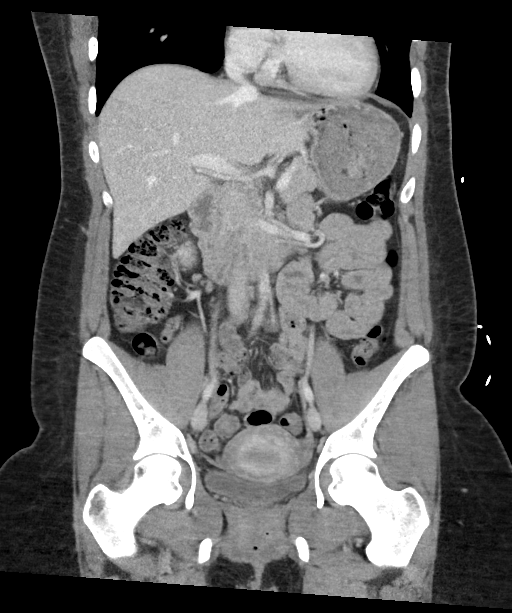
[im 57/102  soft-tissue]
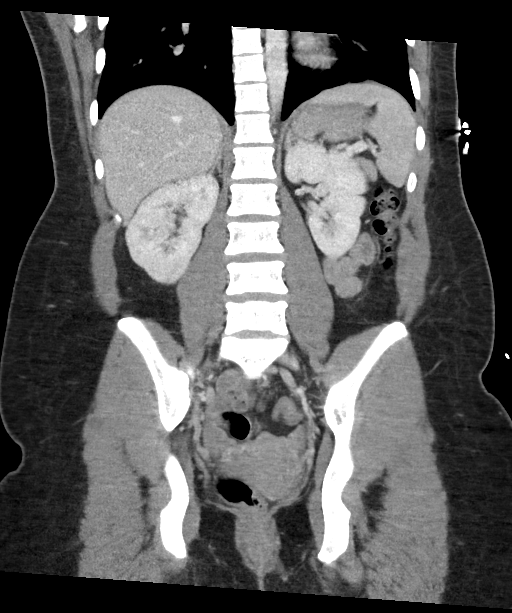

[16 of 46 positions shown; findings below may reference images not displayed]

FINDINGS: Lower chest: Lung bases are clear.

Hepatobiliary: No focal liver abnormality is seen. No gallstones,
gallbladder wall thickening, or biliary dilatation.

Pancreas: Unremarkable. No pancreatic ductal dilatation or
surrounding inflammatory changes.

Spleen: Normal in size without focal abnormality.

Adrenals/Urinary Tract: 2 mm stone in the upper pole right kidney.
Additional punctate stones in the midpole right kidney. No
hydronephrosis or hydroureter. Renal nephrograms are symmetrical.
Bladder is unremarkable.

Stomach/Bowel: Stomach is within normal limits. Appendix appears
normal. No evidence of bowel wall thickening, distention, or
inflammatory changes. No evidence of diverticulitis.

Vascular/Lymphatic: No significant vascular findings are present. No
enlarged abdominal or pelvic lymph nodes.

Reproductive: Uterus and bilateral adnexa are unremarkable.

Other: No abdominal wall hernia or abnormality. No abdominopelvic
ascites.

Musculoskeletal: No acute or significant osseous findings.
IMPRESSION: 1. No acute process demonstrated in the abdomen or pelvis. No
evidence of bowel obstruction or inflammation. No changes to suggest
diverticulitis.
2. Nonobstructing stones in the right kidney.
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
